# Patient Record
Sex: Male | Born: 1995 | Race: White | Hispanic: No | Marital: Single | Smoking: Former smoker
Health system: Southern US, Community
[De-identification: ages and names within clinical notes are randomized; demographics above are authoritative.]

## PROBLEM LIST (undated history)

## (undated) HISTORY — PX: APPENDECTOMY: SHX54

---
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
## 2017-11-16 ENCOUNTER — Encounter (HOSPITAL_COMMUNITY): Payer: Self-pay | Admitting: *Deleted

## 2017-11-16 ENCOUNTER — Emergency Department (HOSPITAL_COMMUNITY)
Admission: EM | Admit: 2017-11-16 | Discharge: 2017-11-17 | Disposition: A | Discharge status: Home / Self Care | Payer: BLUE CROSS/BLUE SHIELD | Attending: Emergency Medicine | Admitting: Emergency Medicine

## 2017-11-16 ENCOUNTER — Other Ambulatory Visit: Payer: Self-pay

## 2017-11-16 DIAGNOSIS — I880 Nonspecific mesenteric lymphadenitis: Secondary | ICD-10-CM | POA: Insufficient documentation

## 2017-11-16 DIAGNOSIS — R1031 Right lower quadrant pain: Secondary | ICD-10-CM | POA: Diagnosis present

## 2017-11-16 DIAGNOSIS — F17200 Nicotine dependence, unspecified, uncomplicated: Secondary | ICD-10-CM | POA: Diagnosis not present

## 2017-11-16 LAB — COMPREHENSIVE METABOLIC PANEL
ALT: 25 U/L (ref 0–44)
AST: 24 U/L (ref 15–41)
Albumin: 4.4 g/dL (ref 3.5–5.0)
Alkaline Phosphatase: 78 U/L (ref 38–126)
Anion gap: 9 (ref 5–15)
BUN: 17 mg/dL (ref 6–20)
CO2: 26 mmol/L (ref 22–32)
Calcium: 9.2 mg/dL (ref 8.9–10.3)
Chloride: 105 mmol/L (ref 98–111)
Creatinine, Ser: 0.99 mg/dL (ref 0.61–1.24)
GFR calc Af Amer: >60 mL/min (ref >60)
GFR calc non Af Amer: >60 mL/min (ref >60)
Glucose, Bld: 96 mg/dL (ref 70–99)
Potassium: 3.9 mmol/L (ref 3.5–5.1)
Sodium: 140 mmol/L (ref 135–145)
Total Bilirubin: 0.4 mg/dL (ref 0.3–1.2)
Total Protein: 7 g/dL (ref 6.5–8.1)

## 2017-11-16 LAB — URINALYSIS, ROUTINE W REFLEX MICROSCOPIC
BILIRUBIN URINE: NEGATIVE
Glucose, UA: NEGATIVE mg/dL
Hgb urine dipstick: NEGATIVE
Ketones, ur: NEGATIVE mg/dL
Leukocytes, UA: NEGATIVE
NITRITE: NEGATIVE
PROTEIN: NEGATIVE mg/dL
SPECIFIC GRAVITY, URINE: 1.024 (ref 1.005–1.030)
pH: 6 (ref 5.0–8.0)

## 2017-11-16 LAB — CBC
HCT: 44 % (ref 39.0–52.0)
HEMOGLOBIN: 15.7 g/dL (ref 13.0–17.0)
MCH: 31.1 pg (ref 26.0–34.0)
MCHC: 35.7 g/dL (ref 30.0–36.0)
MCV: 87.1 fL (ref 78.0–100.0)
PLATELETS: 260 10*3/uL (ref 150–400)
RBC: 5.05 MIL/uL (ref 4.22–5.81)
RDW: 12 % (ref 11.5–15.5)
WBC: 8.2 10*3/uL (ref 4.0–10.5)

## 2017-11-16 LAB — LIPASE, BLOOD: Lipase: 36 U/L (ref 11–51)

## 2017-11-16 NOTE — ED Triage Notes (Signed)
Pt c/o RLQ pain x 1 week.  Pt denies n/v.  Pt c/o tenderness with palpation.

## 2017-11-17 ENCOUNTER — Emergency Department (HOSPITAL_COMMUNITY): Payer: BLUE CROSS/BLUE SHIELD

## 2017-11-17 ENCOUNTER — Encounter (HOSPITAL_COMMUNITY): Payer: Self-pay

## 2017-11-17 MED ORDER — SODIUM CHLORIDE 0.9 % IV SOLN
INTRAVENOUS | Status: DC
  Administered 2017-11-17: 01:00:00 via INTRAVENOUS

## 2017-11-17 MED ORDER — KETOROLAC TROMETHAMINE 30 MG/ML IJ SOLN
30.0000 mg | Freq: Once | INTRAMUSCULAR | Status: AC
  Administered 2017-11-17: 30 mg via INTRAVENOUS
  Filled 2017-11-17: qty 1

## 2017-11-17 MED ORDER — IOPAMIDOL (ISOVUE-300) INJECTION 61%
INTRAVENOUS | Status: AC
  Filled 2017-11-17: qty 100

## 2017-11-17 MED ORDER — ONDANSETRON HCL 4 MG/2ML IJ SOLN
4.0000 mg | Freq: Once | INTRAMUSCULAR | Status: AC
  Administered 2017-11-17: 4 mg via INTRAVENOUS
  Filled 2017-11-17: qty 2

## 2017-11-17 MED ORDER — IOPAMIDOL (ISOVUE-300) INJECTION 61%
100.0000 mL | Freq: Once | INTRAVENOUS | Status: AC | PRN
  Administered 2017-11-17: 100 mL via INTRAVENOUS

## 2017-11-17 MED ORDER — MORPHINE SULFATE (PF) 4 MG/ML IV SOLN
4.0000 mg | Freq: Once | INTRAVENOUS | Status: AC
  Administered 2017-11-17: 4 mg via INTRAVENOUS
  Filled 2017-11-17: qty 1

## 2017-11-17 MED ORDER — IBUPROFEN 800 MG PO TABS: 800.0000 mg | ORAL_TABLET | Freq: Three times a day (TID) | ORAL | 0 refills | Status: AC | PRN

## 2017-11-17 NOTE — ED Notes (Signed)
Patient ambulated to BR with no difficulties.

## 2017-11-17 NOTE — Discharge Instructions (Signed)
You may alternate Tylenol 1000 mg every 6 hours as needed for pain and Ibuprofen 800 mg every 8 hours as needed for pain.  Please take Ibuprofen with food. ° °

## 2017-11-17 NOTE — ED Notes (Signed)
Patient transported to CT 

## 2017-11-17 NOTE — ED Provider Notes (Signed)
TIME SEEN: 12:33 AM  CHIEF COMPLAINT: Right-sided abdominal pain  HPI: Patient is a 22 year old male with history of previous appendectomy who presents to the emergency department with 1-1/2 weeks of sharp right-sided abdominal pain.  Progressively worsening.  Denies fevers, nausea, vomiting, diarrhea.  Has had some dysuria but no hematuria, testicular pain or swelling, penile discharge.  Does not get worse after eating.  No known history of kidney stones.  ROS: See HPI Constitutional: no fever  Eyes: no drainage  ENT: no runny nose   Cardiovascular:  no chest pain  Resp: no SOB  GI: no vomiting GU: no dysuria Integumentary: no rash  Allergy: no hives  Musculoskeletal: no leg swelling  Neurological: no slurred speech ROS otherwise negative  PAST MEDICAL HISTORY/PAST SURGICAL HISTORY:  History reviewed. No pertinent past medical history.  MEDICATIONS:  Prior to Admission medications   Medication Sig Start Date End Date Taking? Authorizing Provider  ibuprofen (ADVIL,MOTRIN) 200 MG tablet Take 400 mg by mouth every 6 (six) hours as needed for moderate pain.   Yes [provider]    ALLERGIES:  Allergies  Allergen Reactions  . Amoxicillin-Pot Clavulanate Hives and Itching    Has patient had a PCN reaction causing immediate rash, facial/tongue/throat swelling, SOB or lightheadedness with hypotension: Yes Has patient had a PCN reaction causing severe rash involving mucus membranes or skin necrosis: No Has patient had a PCN reaction that required hospitalization: No Has patient had a PCN reaction occurring within the last 10 years: No If all of the above answers are "NO", then may proceed with Cephalosporin use.     SOCIAL HISTORY:  Social History   Tobacco Use  . Smoking status: Current Every Day Smoker  Substance Use Topics  . Alcohol use: Not Currently    FAMILY HISTORY: No family history on file.  EXAM: BP (!) 154/93 (BP Location: Left Arm)   Pulse 71    Temp 98.2 F (36.8 C) (Oral)   Resp 16   Ht 6' (1.829 m)   Wt 104.3 kg   SpO2 100%   BMI 31.19 kg/m  CONSTITUTIONAL: Alert and oriented and responds appropriately to questions. Well-appearing; well-nourished HEAD: Normocephalic EYES: Conjunctivae clear, pupils appear equal, EOMI ENT: normal nose; moist mucous membranes NECK: Supple, no meningismus, no nuchal rigidity, no LAD  CARD: RRR; S1 and S2 appreciated; no murmurs, no clicks, no rubs, no gallops RESP: Normal chest excursion without splinting or tachypnea; breath sounds clear and equal bilaterally; no wheezes, no rhonchi, no rales, no hypoxia or respiratory distress, speaking full sentences ABD/GI: Normal bowel sounds; non-distended; soft, there are throughout the right abdomen and right flank, negative Murphy sign, no rebound, no guarding, no peritoneal signs, no hepatosplenomegaly BACK:  The back appears normal and is non-tender to palpation, there is no CVA tenderness EXT: Normal ROM in all joints; non-tender to palpation; no edema; normal capillary refill; no cyanosis, no calf tenderness or swelling    SKIN: Normal color for age and race; warm; no rash NEURO: Moves all extremities equally PSYCH: The patient's mood and manner are appropriate. Grooming and personal hygiene are appropriate.  MEDICAL DECISION MAKING: Patient here with right-sided abdominal pain.  Differential includes kidney stones, colitis, cholecystitis or cholelithiasis, less likely bowel obstruction.  Labs, urine unremarkable.  Will give pain and nausea medicine and obtain CT scan for further evaluation.  ED PROGRESS: CT scan shows mesenteric adenitis with no other acute abnormality.  I do not feel he needs antibiotics at this  time.  Recommended alternating Tylenol and Motrin.  Pain improved after Toradol.  Will give outpatient PCP follow-up information and discussed return precautions.   At this time, I do not feel there is any life-threatening condition present.  I have reviewed and discussed all results (EKG, imaging, lab, urine as appropriate) and exam findings with patient/family. I have reviewed nursing notes and appropriate previous records.  I feel the patient is safe to be discharged home without further emergent workup and can continue workup as an outpatient as needed. Discussed usual and customary return precautions. Patient/family verbalize understanding and are comfortable with this plan.  Outpatient follow-up has been provided if needed. All questions have been answered.      Ward, Layla Maw, DO 11/17/17 Earle Gell

## 2019-05-23 ENCOUNTER — Encounter: Payer: BC Managed Care – PPO | Admitting: Neurology

## 2019-05-23 ENCOUNTER — Ambulatory Visit (INDEPENDENT_AMBULATORY_CARE_PROVIDER_SITE_OTHER): Payer: BC Managed Care – PPO | Admitting: Neurology

## 2019-05-23 DIAGNOSIS — Z0289 Encounter for other administrative examinations: Secondary | ICD-10-CM

## 2019-05-23 DIAGNOSIS — R200 Anesthesia of skin: Secondary | ICD-10-CM | POA: Diagnosis not present

## 2019-05-23 DIAGNOSIS — G588 Other specified mononeuropathies: Secondary | ICD-10-CM

## 2019-05-23 DIAGNOSIS — M79606 Pain in leg, unspecified: Secondary | ICD-10-CM

## 2019-05-23 DIAGNOSIS — G5780 Other specified mononeuropathies of unspecified lower limb: Secondary | ICD-10-CM | POA: Insufficient documentation
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
## 2019-11-10 LAB — BASIC METABOLIC PANEL
BUN: 13 (ref 4–21)
Creatinine: 0.8 (ref 0.6–1.3)

## 2019-11-10 LAB — LIPID PANEL
Cholesterol: 157 (ref 0–200)
HDL: 28 — AB (ref 35–70)
LDL Cholesterol: 82
Triglycerides: 236 — AB (ref 40–160)

## 2019-11-10 LAB — TSH: TSH: 1.39 (ref 0.41–5.90)

## 2019-11-10 LAB — CBC AND DIFFERENTIAL: HCT: 42 (ref 41–53)

## 2019-12-15 ENCOUNTER — Other Ambulatory Visit: Payer: Self-pay

## 2019-12-15 ENCOUNTER — Ambulatory Visit (INDEPENDENT_AMBULATORY_CARE_PROVIDER_SITE_OTHER): Payer: BC Managed Care – PPO | Admitting: "Endocrinology

## 2019-12-15 ENCOUNTER — Encounter: Payer: Self-pay | Admitting: "Endocrinology

## 2019-12-15 VITALS — BP 142/89 | HR 64 | Ht 71.65 in | Wt 253.0 lb

## 2019-12-15 DIAGNOSIS — E291 Testicular hypofunction: Secondary | ICD-10-CM | POA: Diagnosis not present

## 2019-12-15 DIAGNOSIS — F17200 Nicotine dependence, unspecified, uncomplicated: Secondary | ICD-10-CM | POA: Diagnosis not present

## 2019-12-15 DIAGNOSIS — E782 Mixed hyperlipidemia: Secondary | ICD-10-CM | POA: Diagnosis not present

## 2019-12-15 NOTE — Progress Notes (Signed)
Endocrinology Consult Note   Christopher Brown, 24 y.o., male   Chief Complaint  Patient presents with  . Establish Care    Low testosterone     History reviewed. No pertinent past medical history. Past Surgical History:  Procedure Laterality Date  . APPENDECTOMY     Social History   Socioeconomic History  . Marital status: Single    Spouse name: Not on file  . Number of children: Not on file  . Years of education: Not on file  . Highest education level: Not on file  Occupational History  . Not on file  Tobacco Use  . Smoking status: Former Games developer  . Smokeless tobacco: Never Used  Vaping Use  . Vaping Use: Every day  Substance and Sexual Activity  . Alcohol use: Yes    Comment: Socially/weekends  . Drug use: Never  . Sexual activity: Not on file  Other Topics Concern  . Not on file  Social History Narrative  . Not on file   Social Determinants of Health   Financial Resource Strain:   . Difficulty of Paying Living Expenses: Not on file  Food Insecurity:   . Worried About Programme researcher, broadcasting/film/video in the Last Year: Not on file  . Ran Out of Food in the Last Year: Not on file  Transportation Needs:   . Lack of Transportation (Medical): Not on file  . Lack of Transportation (Non-Medical): Not on file  Physical Activity:   . Days of Exercise per Week: Not on file  . Minutes of Exercise per Session: Not on file  Stress:   . Feeling of Stress : Not on file  Social Connections:   . Frequency of Communication with Friends and Family: Not on file  . Frequency of Social Gatherings with Friends and Family: Not on file  . Attends Religious Services: Not on file  . Active Member of Clubs or Organizations: Not on file  . Attends Banker Meetings: Not on file  . Marital Status: Not on file   Outpatient Encounter Medications as of 12/15/2019  Medication Sig  . ibuprofen (ADVIL,MOTRIN) 800 MG tablet Take 1 tablet (800  mg total) by mouth every 8 (eight) hours as needed for mild pain.   No facility-administered encounter medications on file as of 12/15/2019.   ALLERGIES: Allergies  Allergen Reactions  . Amoxicillin-Pot Clavulanate Hives and Itching    Has patient had a PCN reaction causing immediate rash, facial/tongue/throat swelling, SOB or lightheadedness with hypotension: Yes Has patient had a PCN reaction causing severe rash involving mucus membranes or skin necrosis: No Has patient had a PCN reaction that required hospitalization: No Has patient had a PCN reaction occurring within the last 10 years: No If all of the above answers are "NO", then may proceed with Cephalosporin use.     VACCINATION STATUS:  There is no immunization history on file for this patient.  HPI: Christopher Brown is a 24 y.o.-year-old man, being seen in consultation for evaluation and management of low testosterone requested by by his   provider  Lindley Magnus, PA-C.  He was found to have hypogonadism with total testostetone of at 10 AM on November 10, 2019 with free testosterone also low at 5.5 at the same time. -Of note, patient was coming off of third shift work cycle around time.   -Blood work was done due to the need for work-up for anxiety.  He was found to have normal thyroid function tests. He denies any  problem with libido.  He does not have difficulty obtaining and maintaining erection.  He does not father any children yet.  He denies  trauma to testes,  chemotherapy,  testicular irradiation,  nor genitourinary surgery. No h/o cryptorchidism. He denies history of  mumps orchitis/ history of  autoimmune disorders.  He grew and went through puberty like his peers. No personal history of  infertility .  He started to grow facial hair and shaving at appropriate times compared to his brother and peers. No incomplete/delayed sexual development.     No breast discomfort/gynecomastia. No abnormal sense of smell (only  allergies). No hot flushes. No vision problems.  No report of changing headaches. No FH of hypogonadism/infertility . No Family history of hemochromatosis or pituitary tumors. No recent rapid weight change. No chronic diseases. No chronic pain. Not on opiates.  He smokes and vapes on regular basis.  He denies excessive alcohol intake. No anabolic steroids use. No herbal medicines. Not on antidepressants.  He does not have family  Or personal history of premature  cardiac disease.   ROS: Constitutional: + Progressive weight gain,  no fatigue, no subjective hyperthermia/hypothermia Eyes: no blurry vision, no xerophthalmia ENT: no sore throat, no nodules palpated in throat, no dysphagia/odynophagia, no hoarseness Cardiovascular: no CP/SOB/palpitations/leg swelling Respiratory: no cough/SOB Gastrointestinal: no N/V/D/C Musculoskeletal: no muscle/joint aches Skin: no rashes Neurological: no tremors/numbness/tingling/dizziness Psychiatric: no depression/anxiety  PE: BP (!) 142/89   Pulse 64   Ht 5' 11.65" (1.82 m)   Wt 253 lb (114.8 kg)   BMI 34.65 kg/m  Wt Readings from Last 3 Encounters:  12/15/19 253 lb (114.8 kg)  11/16/17 230 lb (104.3 kg) (98 %, Z= 2.04)*   * Growth percentiles are based on CDC (Boys, 2-20 Years) data.   Constitutional:  Body mass index is 34.65 kg/m.,  not in acute distress, + Normal State of Mind Eyes: PERRLA, EOMI, no exophthalmos ENT: moist mucous membranes, no thyromegaly, no cervical lymphadenopathy Cardiovascular: RRR, No MRG Respiratory: CTA B Gastrointestinal: abdomen soft, NT, ND, BS+ Musculoskeletal: no deformities, strength intact in all 4 Skin: moist, warm, no rashes Neurological: no tremor with outstretched hands, DTR normal in all 4 Genital exam: normal male escutcheon, no inguinal LAD, normal phallus, testes ~25 mL bilaterally, no testicular masses, no penile discharge.  No gynecomastia.   CMP ( most recent) CMP     Component Value  Date/Time   NA 140 11/16/2017 2212   K 3.9 11/16/2017 2212   CL 105 11/16/2017 2212   CO2 26 11/16/2017 2212   GLUCOSE 96 11/16/2017 2212   BUN 13 11/10/2019 0000   CREATININE 0.8 11/10/2019 0000   CREATININE 0.99 11/16/2017 2212   CALCIUM 9.2 11/16/2017 2212   PROT 7.0 11/16/2017 2212   ALBUMIN 4.4 11/16/2017 2212   AST 24 11/16/2017 2212   ALT 25 11/16/2017 2212   ALKPHOS 78 11/16/2017 2212   BILITOT 0.4 11/16/2017 2212   GFRNONAA >60 11/16/2017 2212   GFRAA >60 11/16/2017 2212   Recent Results (from the past 2160 hour(s))  CBC and differential     Status: None   Collection Time: 11/10/19 12:00 AM  Result Value Ref Range   HCT 42 41 - 53  Basic metabolic panel     Status: None   Collection Time: 11/10/19 12:00 AM  Result Value Ref Range   BUN 13 4 - 21   Creatinine 0.8 0.6 - 1.3  Lipid panel     Status: Abnormal  Collection Time: 11/10/19 12:00 AM  Result Value Ref Range   Triglycerides 236 (A) 40 - 160   Cholesterol 157 0 - 200   HDL 28 (A) 35 - 70   LDL Cholesterol 82   TSH     Status: None   Collection Time: 11/10/19 12:00 AM  Result Value Ref Range   TSH 1.39 0.41 - 5.90    Comment: ft4 1.46   November 10, 2019 9:57 AM labs: Total testosterone 242 (normal 264-916) Free testosterone 5.5 (normal 9.3-26.5) Prolactin 13.8  ASSESSMENT: 1. Hypogonadism 2.  Smoking/vaping 3.  Hyperlipidemia   PLAN:  I have examined the patient, reviewed his labs and have had a long discussion with the patient regarding his testosterone results.   - I have discussed potential causes of hypogonadism, diagnosis of hypogonadism,  and relevant workup to confirm the diagnosis of hypogonadism before initiating testosterone replacement therapy.  -This patient with no suggestive clinical symptoms and the fact that his labs were done when he was just transitioning from a third shift work, repeat labs is necessary to make the diagnosis. He is approached for repeat labs including total  testosterone, SHBG, FSH, LH before initiating any treatment with testosterone. -It is beneficial to determine etiology of hypogonadism before initiation of treatment if possible. -If his labs suggest the possibility of secondary hypogonadism, he will be considered for imaging of the sella/pituitary. -I shared the fact that he is current testosterone level is enough for reproductive purposes and testosterone supplement can potentially diminish his fertility.  He does not have an immediate plan for making family yet.  -Patient with a BMI of 34.6, he would benefit the most from weight loss.  This was discussed briefly with him.  His work schedule is now first shift which potentially can help stabilize his testosterone levels. -The other crucial intervention for him would be smoking cessation.  I discussed smoking cessation in detail with him. The patient was counseled on the dangers of tobacco use, and was advised to quit.  Reviewed strategies to maximize success, including removing cigarettes and smoking materials from environment. I did not initiate any prescriptions for him today.  We had discussion about the importance of exercise from the point of view of improving his vascular response to the pelvis and lower extremities , also would benefit from the point of view of hyperlipidemia.    He is advised to maintain close follow-up with his PCP.  - Time spent with the patient: 60 minutes, of which >50% was spent in obtaining information about his symptoms, reviewing his previous labs, evaluations, and treatments, counseling him about his hypogonadism, smoking, and developing a plan to confirm the diagnosis and long term treatment as necessary.  Christopher Brown participated in the discussions, expressed understanding, and voiced agreement with the above plans.  All questions were answered to his satisfaction. he is encouraged to contact clinic should he have any questions or concerns prior to his return  visit.  Return in about 10 days (around 12/25/2019) for Fasting Labs  in AM B4 8.  Marquis Lunch, MD [facility] [address] [place], [place] [address] Phone: [phone]  Fax: [phone]   12/15/2019, 12:41 PM  This note was partially dictated with voice recognition software. Similar sounding words can be transcribed inadequately or may not  be corrected upon review.

## 2019-12-29 ENCOUNTER — Ambulatory Visit: Payer: BC Managed Care – PPO | Admitting: "Endocrinology

## 2020-02-05 IMAGING — CT CT ABD-PELV W/ CM
2 of 4 series · 17 of 46 positions shown, 19 images · IV contrast (ISOVUE)
Comparison: None.

CLINICAL DATA: Right-sided abdominal pain. Acute abdominal pain.
History of appendectomy.

EXAM:
CT ABDOMEN AND PELVIS WITH CONTRAST
TECHNIQUE: Multidetector CT imaging of the abdomen and pelvis was performed
using the standard protocol following bolus administration of
intravenous contrast.
CONTRAST:  100mL [id number] IOPAMIDOL ([id number]-BWW) INJECTION 61%

[Series 2: axial st · axial · 0.97mm/px · z∈[+832,+1296]mm · 14 of 105 slices shown, 16 images]
[im 6/105  soft-tissue]
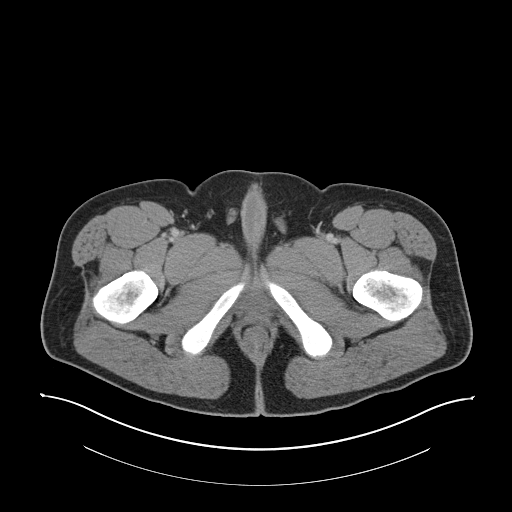
[im 6/105  bone]
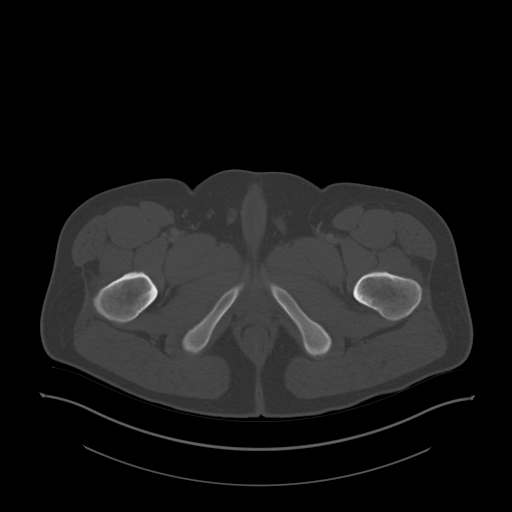
[im 11/105  soft-tissue]
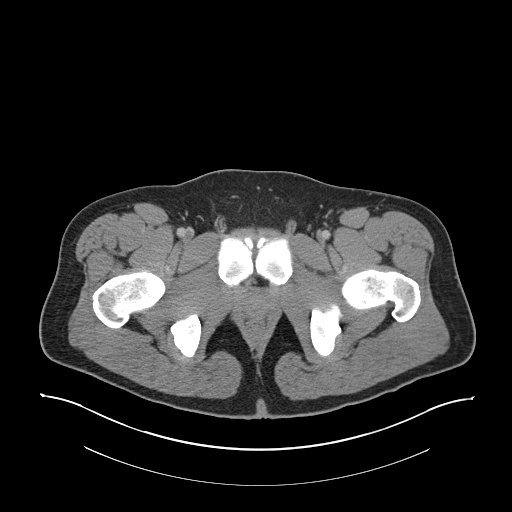
[im 22/105  soft-tissue]
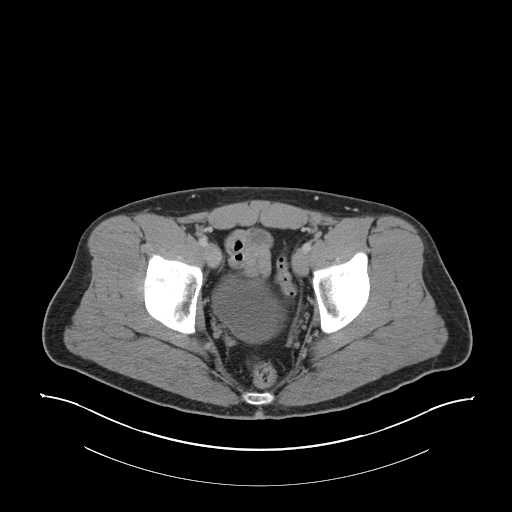
[im 28/105  soft-tissue]
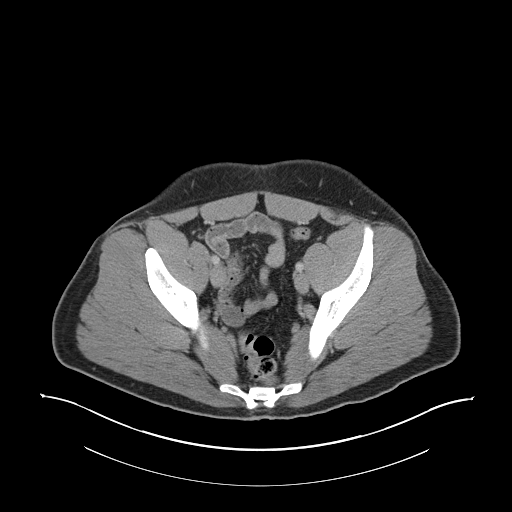
[im 33/105  soft-tissue]
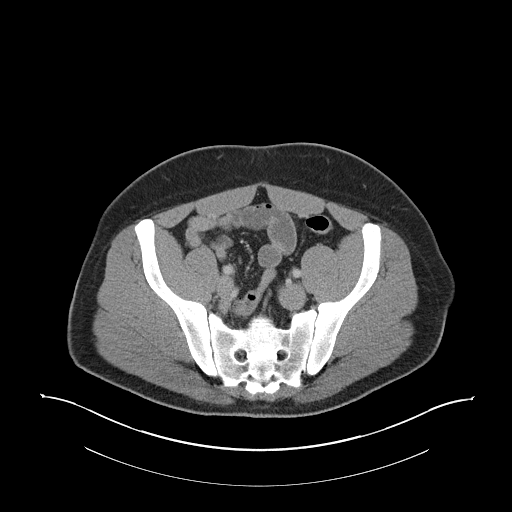
[im 44/105  soft-tissue]
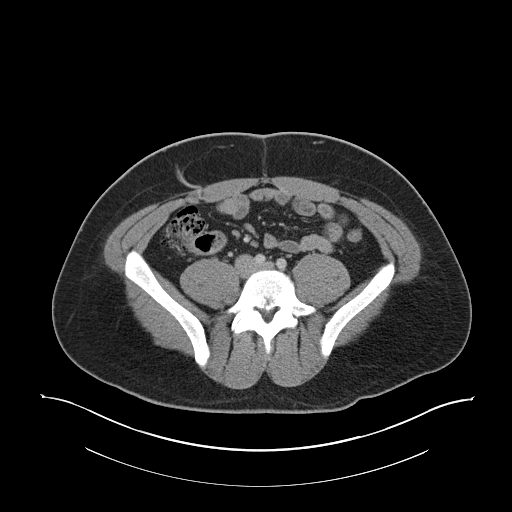
[im 50/105  soft-tissue]
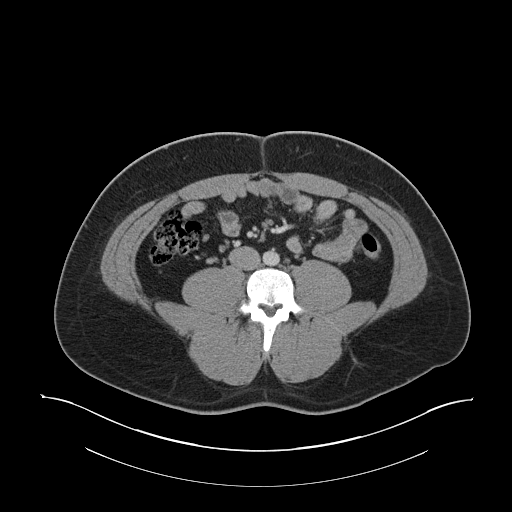
[im 55/105  soft-tissue]
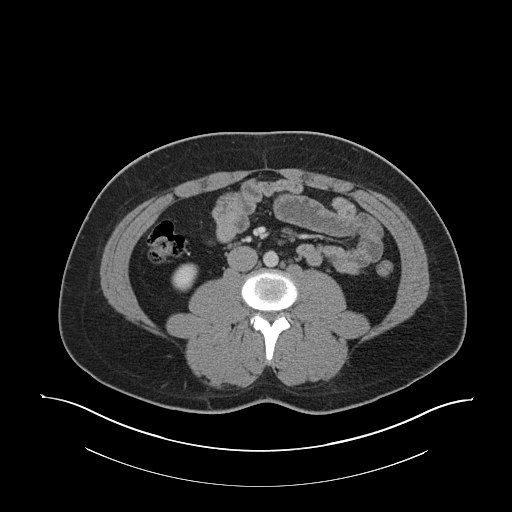
[im 61/105  soft-tissue]
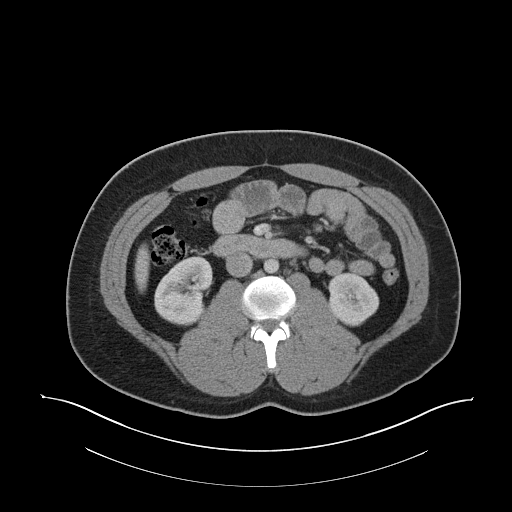
[im 61/105  bone]
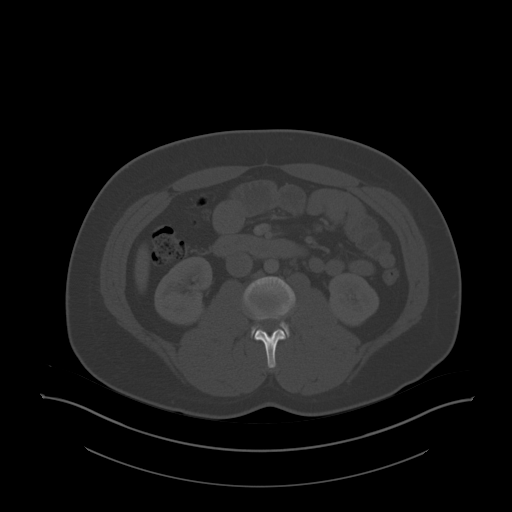
[im 72/105  soft-tissue]
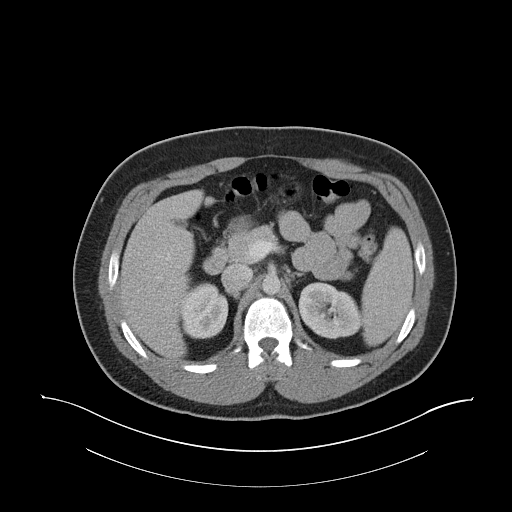
[im 77/105  soft-tissue]
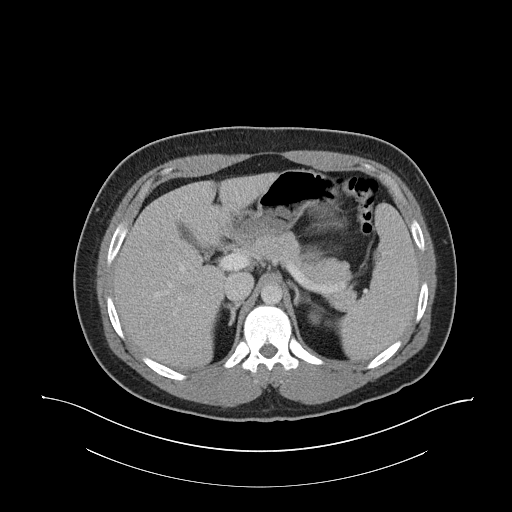
[im 83/105  soft-tissue]
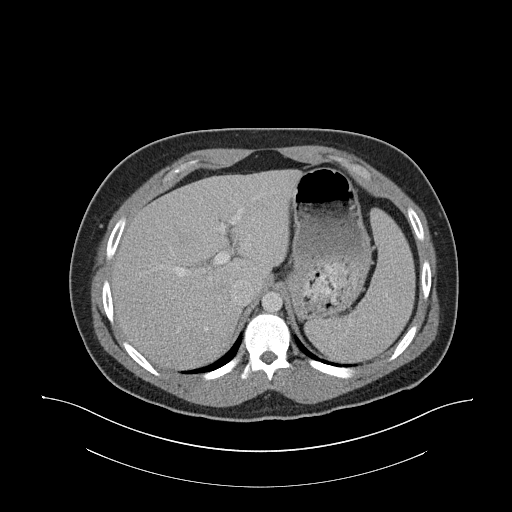
[im 94/105  soft-tissue]
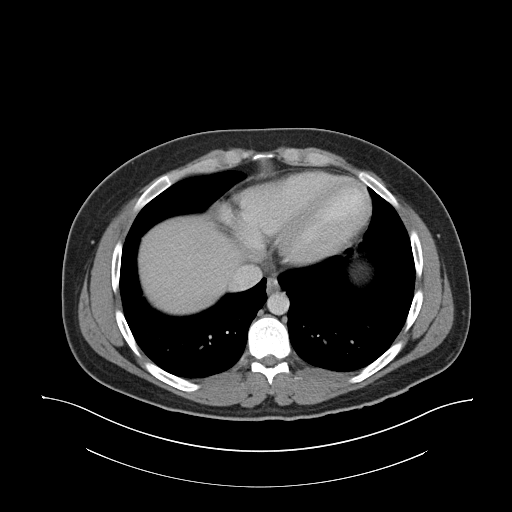
[im 99/105  soft-tissue]
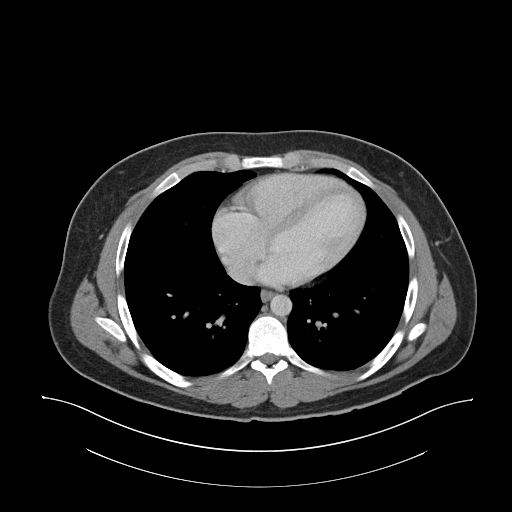

[Series 4: coronal st · coronal · 1.00mm/px · 3 of 101 slices shown]
[im 34/101  soft-tissue]
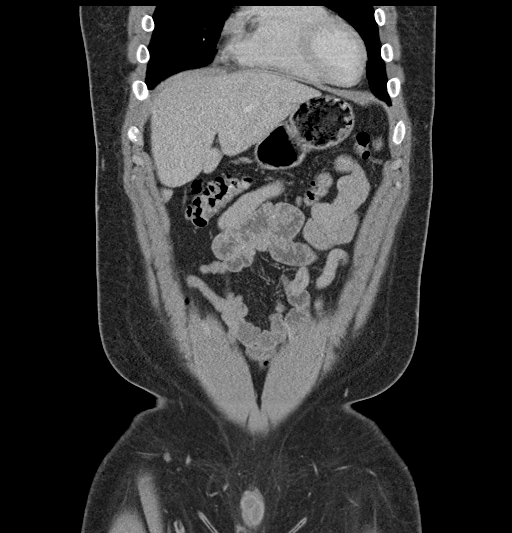
[im 45/101  soft-tissue]
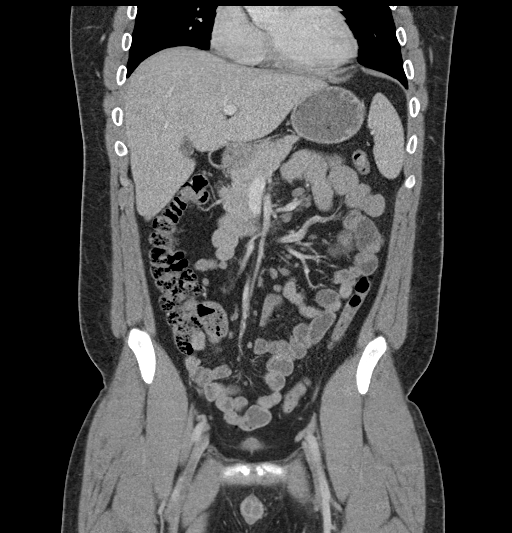
[im 56/101  soft-tissue]
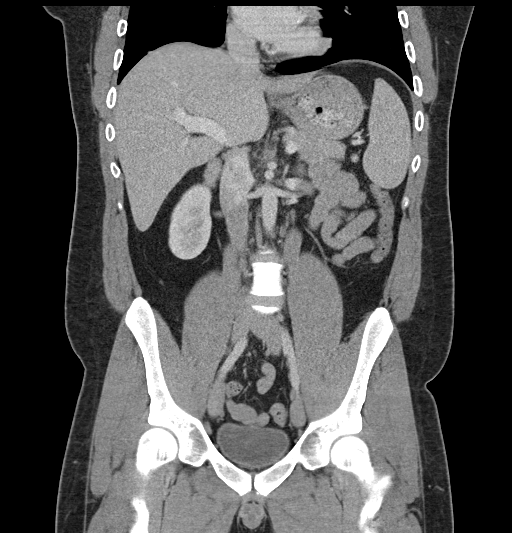

[17 of 46 positions shown; findings below may reference images not displayed]

FINDINGS: Lower chest: The lung bases are clear.

Hepatobiliary: No focal liver abnormality is seen. No gallstones,
gallbladder wall thickening, or biliary dilatation.

Pancreas: No ductal dilatation or inflammation.

Spleen: Mildly enlarged spanning 14.6 cm AP. No focal abnormality.
Small splenule inferiorly.

Adrenals/Urinary Tract: Normal adrenal glands. No hydronephrosis or
perinephric edema. Possible small cysts in the lower right kidney.
Urinary bladder is physiologically distended.

Stomach/Bowel: Stomach is within normal limits. Appendix is not
visualized, surgically absent per history. No evidence of bowel wall
thickening, distention, or inflammatory changes.

Vascular/Lymphatic: Few prominent ileocolic nodes largest measuring
10 mm. Normal vascular structures.

Reproductive: Prostate is unremarkable.

Other: No free air, free fluid, or intra-abdominal fluid collection.

Musculoskeletal: There are no acute or suspicious osseous
abnormalities.
IMPRESSION: 1. Few prominent ileocolic nodes can be seen with mesenteric
adenitis.
2. Mild splenomegaly.

## 2020-04-15 ENCOUNTER — Telehealth: Payer: Self-pay | Admitting: Neurology

## 2020-04-15 NOTE — Telephone Encounter (Signed)
Pt's girlfriend, Suzanne Boron (on [place]). We were never contacted with the results of the Nerve Conduction test he had done. Would like a call from the nurse.

## 2020-04-15 NOTE — Telephone Encounter (Signed)
Debra, can you help with this request? Thank you ?

## 2020-04-15 NOTE — Telephone Encounter (Signed)
It was ordered by orthopedics.  I never saw the patient as a consult.  We can mail the results

## 2020-04-16 NOTE — Telephone Encounter (Signed)
Results mailed to home address

## 2021-04-10 ENCOUNTER — Other Ambulatory Visit (HOSPITAL_BASED_OUTPATIENT_CLINIC_OR_DEPARTMENT_OTHER): Payer: Self-pay

## 2021-04-10 DIAGNOSIS — G4710 Hypersomnia, unspecified: Secondary | ICD-10-CM
